# Patient Record
Sex: Male | Born: 1985 | Race: White | Hispanic: No | Marital: Single | Smoking: Current every day smoker
Health system: Southern US, Community
[De-identification: ages and names within clinical notes are randomized; demographics above are authoritative.]

## PROBLEM LIST (undated history)

## (undated) HISTORY — PX: KNEE SURGERY: SHX244

## (undated) HISTORY — PX: SKULL FRACTURE ELEVATION: SHX781

---

## 2005-04-23 ENCOUNTER — Emergency Department: Payer: Self-pay | Admitting: Emergency Medicine

## 2005-12-25 ENCOUNTER — Emergency Department: Payer: Self-pay | Admitting: Emergency Medicine

## 2007-01-30 ENCOUNTER — Emergency Department: Payer: Self-pay | Admitting: Emergency Medicine

## 2007-02-03 ENCOUNTER — Emergency Department: Payer: Self-pay | Admitting: Unknown Physician Specialty

## 2007-02-04 ENCOUNTER — Emergency Department: Payer: Self-pay | Admitting: Unknown Physician Specialty

## 2008-05-31 ENCOUNTER — Emergency Department: Payer: Self-pay | Admitting: Emergency Medicine

## 2008-06-01 ENCOUNTER — Emergency Department: Payer: Self-pay | Admitting: Emergency Medicine

## 2008-06-02 ENCOUNTER — Emergency Department: Payer: Self-pay | Admitting: Emergency Medicine

## 2009-01-19 ENCOUNTER — Emergency Department: Payer: Self-pay | Admitting: Emergency Medicine

## 2010-07-22 ENCOUNTER — Inpatient Hospital Stay: Payer: Self-pay | Admitting: Psychiatry

## 2012-08-04 ENCOUNTER — Emergency Department: Payer: Self-pay | Admitting: Emergency Medicine

## 2013-01-16 ENCOUNTER — Emergency Department: Payer: Self-pay | Admitting: Emergency Medicine

## 2013-02-10 ENCOUNTER — Emergency Department: Payer: Self-pay | Admitting: Emergency Medicine

## 2014-01-04 ENCOUNTER — Emergency Department (HOSPITAL_COMMUNITY)
Admission: EM | Admit: 2014-01-04 | Discharge: 2014-01-04 | Disposition: A | Discharge status: Home / Self Care | Payer: Self-pay | Attending: Emergency Medicine | Admitting: Emergency Medicine

## 2014-01-04 ENCOUNTER — Encounter (HOSPITAL_COMMUNITY): Payer: Self-pay | Admitting: Emergency Medicine

## 2014-01-04 ENCOUNTER — Emergency Department (HOSPITAL_COMMUNITY): Payer: Self-pay

## 2014-01-04 DIAGNOSIS — S20229A Contusion of unspecified back wall of thorax, initial encounter: Secondary | ICD-10-CM | POA: Insufficient documentation

## 2014-01-04 DIAGNOSIS — F17200 Nicotine dependence, unspecified, uncomplicated: Secondary | ICD-10-CM | POA: Insufficient documentation

## 2014-01-04 DIAGNOSIS — Y9389 Activity, other specified: Secondary | ICD-10-CM | POA: Insufficient documentation

## 2014-01-04 DIAGNOSIS — Y92410 Unspecified street and highway as the place of occurrence of the external cause: Secondary | ICD-10-CM | POA: Insufficient documentation

## 2014-01-04 DIAGNOSIS — M549 Dorsalgia, unspecified: Secondary | ICD-10-CM

## 2014-01-04 DIAGNOSIS — T148XXA Other injury of unspecified body region, initial encounter: Secondary | ICD-10-CM

## 2014-01-04 DIAGNOSIS — E669 Obesity, unspecified: Secondary | ICD-10-CM | POA: Insufficient documentation

## 2014-01-04 MED ORDER — IBUPROFEN 600 MG PO TABS: 600.0000 mg | ORAL_TABLET | Freq: Four times a day (QID) | ORAL | Status: DC | PRN

## 2014-01-04 MED ORDER — SODIUM CHLORIDE 0.9 % IV BOLUS (SEPSIS)
1000.0000 mL | Freq: Once | INTRAVENOUS | Status: AC
  Administered 2014-01-04: 1000 mL via INTRAVENOUS

## 2014-01-04 MED ORDER — ONDANSETRON HCL 4 MG/2ML IJ SOLN
4.0000 mg | Freq: Once | INTRAMUSCULAR | Status: AC
  Administered 2014-01-04: 4 mg via INTRAVENOUS
  Filled 2014-01-04: qty 2

## 2014-01-04 MED ORDER — MORPHINE SULFATE 4 MG/ML IJ SOLN
4.0000 mg | Freq: Once | INTRAMUSCULAR | Status: AC
  Administered 2014-01-04: 4 mg via INTRAVENOUS
  Filled 2014-01-04: qty 1

## 2014-01-04 MED ORDER — TRAMADOL HCL 50 MG PO TABS: 50.0000 mg | ORAL_TABLET | Freq: Four times a day (QID) | ORAL | Status: DC | PRN
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
## 2014-01-04 NOTE — ED Notes (Signed)
Pt ambulated in hallway tolerated well.

## 2014-01-04 NOTE — ED Notes (Signed)
Pt taken to xray 

## 2014-01-04 NOTE — Discharge Instructions (Signed)
Back Pain, Adult Low back pain is very common. About 1 in 5 people have back pain.The cause of low back pain is rarely dangerous. The pain often gets better over time.About half of people with a sudden onset of back pain feel better in just 2 weeks. About 8 in 10 people feel better by 6 weeks.  CAUSES Some common causes of back pain include:  Strain of the muscles or ligaments supporting the spine.  Wear and tear (degeneration) of the spinal discs.  Arthritis.  Direct injury to the back. DIAGNOSIS Most of the time, the direct cause of low back pain is not known.However, back pain can be treated effectively even when the exact cause of the pain is unknown.Answering your caregiver's questions about your overall health and symptoms is one of the most accurate ways to make sure the cause of your pain is not dangerous. If your caregiver needs more information, he or she may order lab work or imaging tests (X-rays or MRIs).However, even if imaging tests show changes in your back, this usually does not require surgery. HOME CARE INSTRUCTIONS For many people, back pain returns.Since low back pain is rarely dangerous, it is often a condition that people can learn to manageon their own.   Remain active. It is stressful on the back to sit or stand in one place. Do not sit, drive, or stand in one place for more than 30 minutes at a time. Take short walks on level surfaces as soon as pain allows.Try to increase the length of time you walk each day.  Do not stay in bed.Resting more than 1 or 2 days can delay your recovery.  Do not avoid exercise or work.Your body is made to move.It is not dangerous to be active, even though your back may hurt.Your back will likely heal faster if you return to being active before your pain is gone.  Pay attention to your body when you bend and lift. Many people have less discomfortwhen lifting if they bend their knees, keep the load close to their bodies,and  avoid twisting. Often, the most comfortable positions are those that put less stress on your recovering back.  Find a comfortable position to sleep. Use a firm mattress and lie on your side with your knees slightly bent. If you lie on your back, put a pillow under your knees.  Only take over-the-counter or prescription medicines as directed by your caregiver. Over-the-counter medicines to reduce pain and inflammation are often the most helpful.Your caregiver may prescribe muscle relaxant drugs.These medicines help dull your pain so you can more quickly return to your normal activities and healthy exercise.  Put ice on the injured area.  Put ice in a plastic bag.  Place a towel between your skin and the bag.  Leave the ice on for 15-20 minutes, 03-04 times a day for the first 2 to 3 days. After that, ice and heat may be alternated to reduce pain and spasms.  Ask your caregiver about trying back exercises and gentle massage. This may be of some benefit.  Avoid feeling anxious or stressed.Stress increases muscle tension and can worsen back pain.It is important to recognize when you are anxious or stressed and learn ways to manage it.Exercise is a great option. SEEK MEDICAL CARE IF:  You have pain that is not relieved with rest or medicine.  You have pain that does not improve in 1 week.  You have new symptoms.  You are generally not feeling well. SEEK   IMMEDIATE MEDICAL CARE IF:   You have pain that radiates from your back into your legs.  You develop new bowel or bladder control problems.  You have unusual weakness or numbness in your arms or legs.  You develop nausea or vomiting.  You develop abdominal pain.  You feel faint. Document Released: 08/06/2005 Document Revised: 02/05/2012 Document Reviewed: 12/25/2010 Exit[employer] Patient Information 2014 [employer].  

## 2014-02-13 ENCOUNTER — Emergency Department: Payer: Self-pay | Admitting: Emergency Medicine

## 2014-06-19 ENCOUNTER — Emergency Department: Payer: Self-pay | Admitting: Emergency Medicine

## 2016-05-04 IMAGING — CR DG LUMBAR SPINE 2-3V
1 series · 3 of 3 positions shown · non-contrast
Comparison: None.

CLINICAL DATA: Back injury bending over. Low back pain. Initial
encounter.

EXAM:
LUMBAR SPINE - 2-3 VIEW

[Series 1: dxr lumbar spine ap and lateral · 0.14mm/px · 3 of 3 slices shown]
[im 1/3]
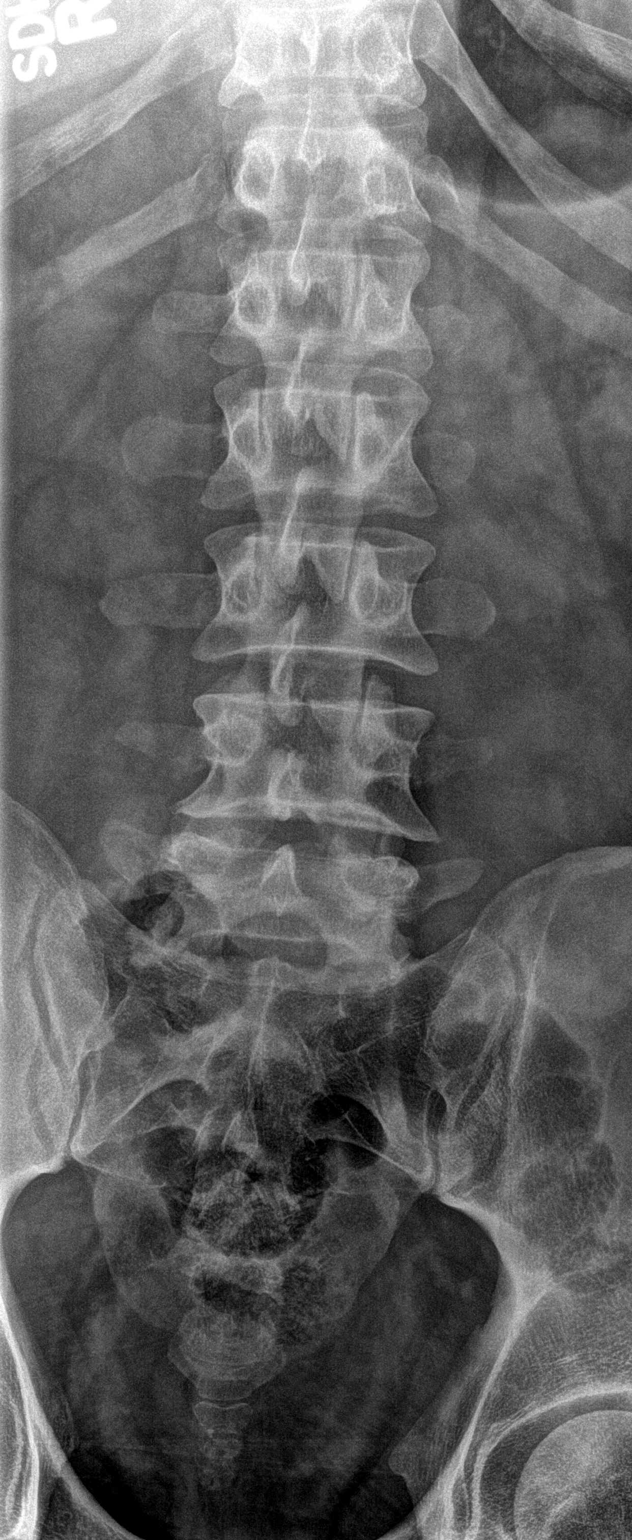
[im 2/3]
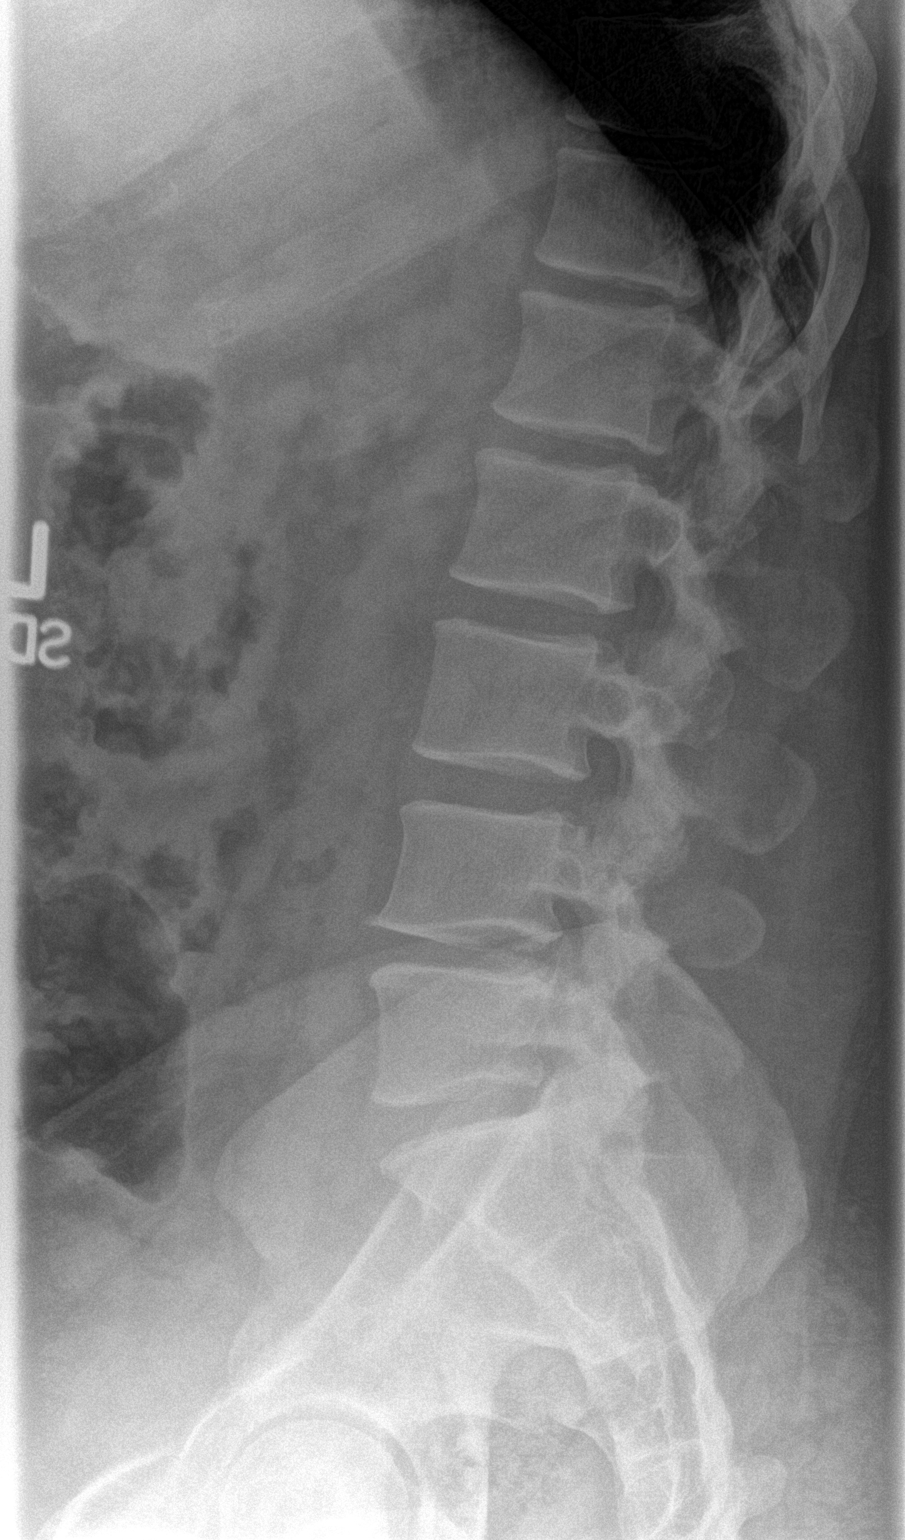
[im 3/3]
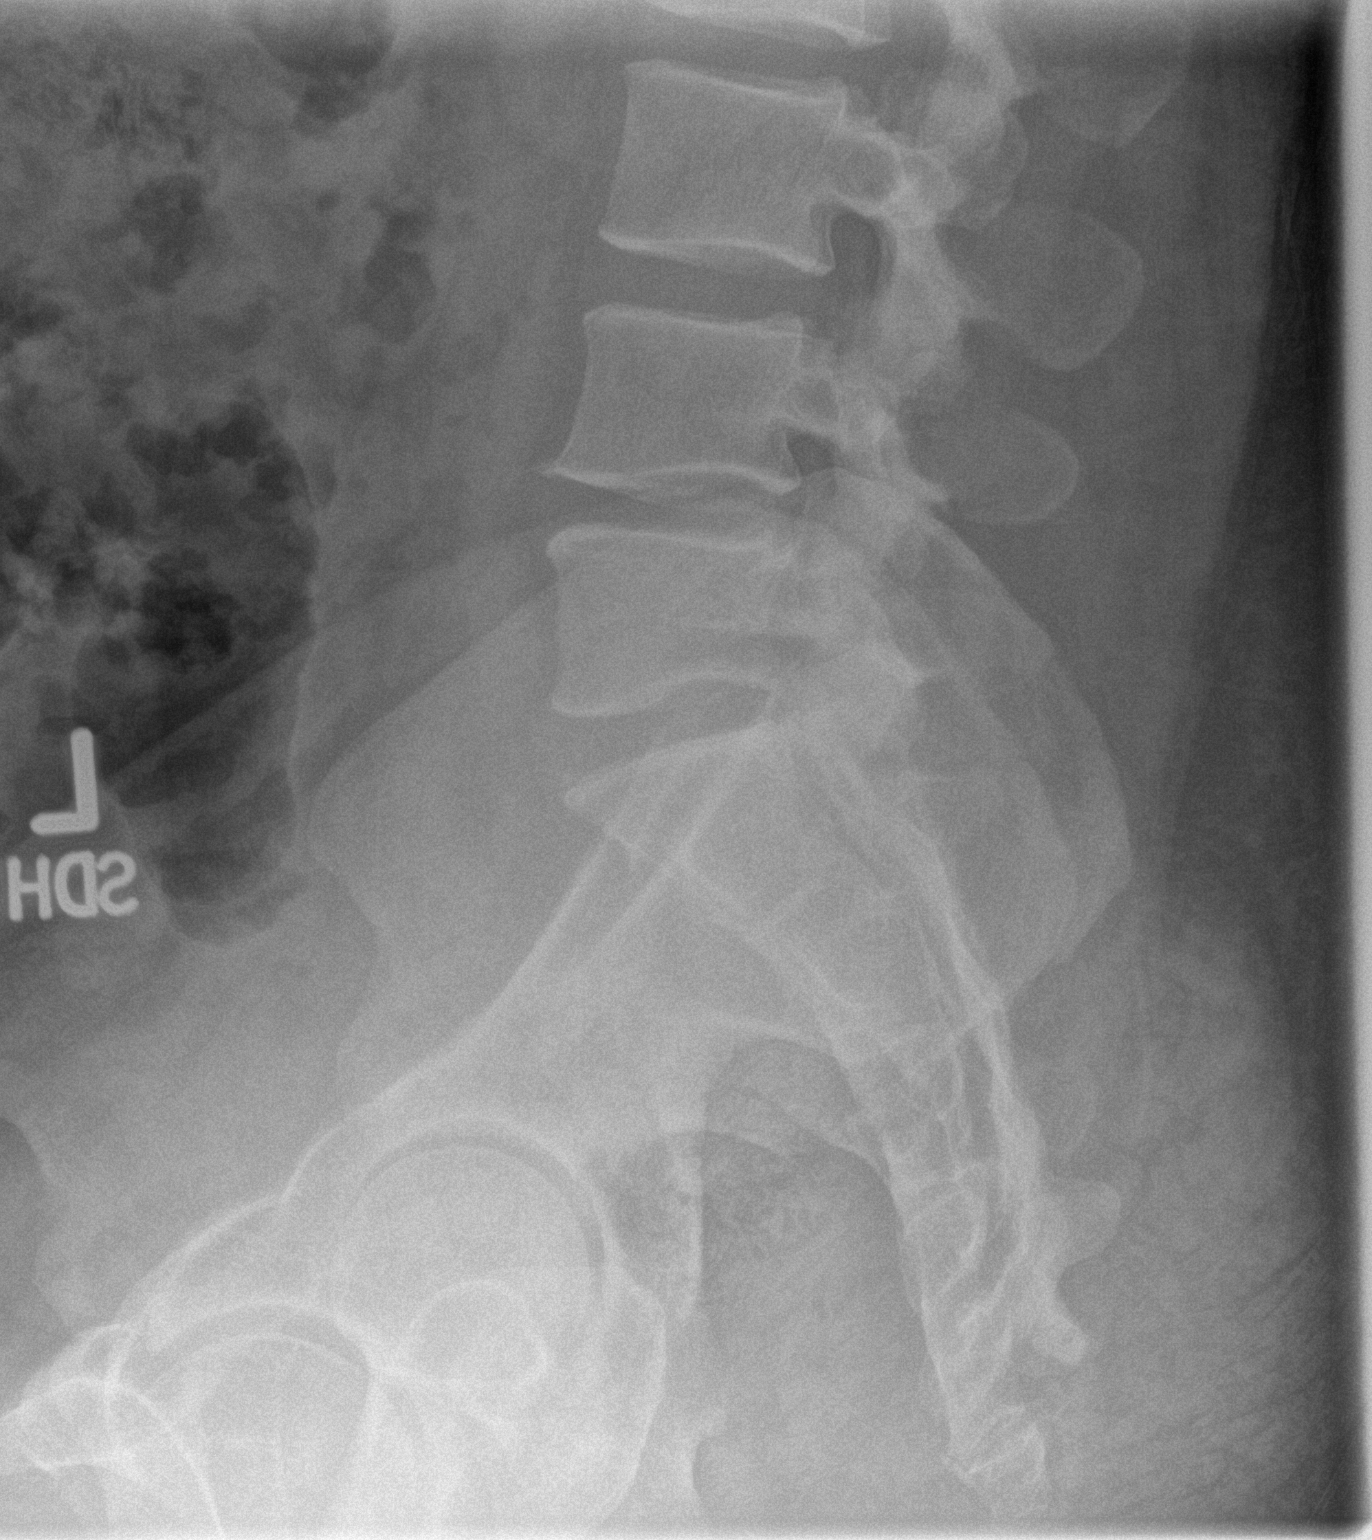

[3 of 3 positions shown; findings below may reference images not displayed]

FINDINGS: There is no evidence of lumbar spine fracture. Alignment is normal.
Mild degenerative disc disease is seen at L4-5.
IMPRESSION: No acute findings.

Mild L4-5 degenerative disc disease.

## 2019-03-19 DIAGNOSIS — G4733 Obstructive sleep apnea (adult) (pediatric): Secondary | ICD-10-CM | POA: Insufficient documentation

## 2019-03-20 DIAGNOSIS — F418 Other specified anxiety disorders: Secondary | ICD-10-CM | POA: Insufficient documentation

## 2024-07-27 DIAGNOSIS — R600 Localized edema: Secondary | ICD-10-CM | POA: Insufficient documentation

## 2024-07-28 ENCOUNTER — Ambulatory Visit (INDEPENDENT_AMBULATORY_CARE_PROVIDER_SITE_OTHER): Payer: Self-pay | Admitting: Internal Medicine

## 2024-07-28 VITALS — BP 186/78 | HR 96 | Resp 18 | Ht 67.0 in | Wt 342.0 lb

## 2024-07-28 DIAGNOSIS — R600 Localized edema: Secondary | ICD-10-CM | POA: Diagnosis not present

## 2024-07-28 DIAGNOSIS — Z6841 Body Mass Index (BMI) 40.0 and over, adult: Secondary | ICD-10-CM | POA: Diagnosis not present

## 2024-07-28 DIAGNOSIS — G2581 Restless legs syndrome: Secondary | ICD-10-CM

## 2024-07-28 DIAGNOSIS — G4733 Obstructive sleep apnea (adult) (pediatric): Secondary | ICD-10-CM

## 2024-07-28 DIAGNOSIS — I10 Essential (primary) hypertension: Secondary | ICD-10-CM | POA: Diagnosis not present

## 2024-07-28 DIAGNOSIS — F418 Other specified anxiety disorders: Secondary | ICD-10-CM

## 2024-07-28 NOTE — Patient Instructions (Signed)
 Living With Sleep Apnea Sleep apnea is a condition that affects your breathing while you're sleeping. Your tongue or the tissue in your throat may block the flow of air while you sleep. You may have shallow breathing or stop breathing for short periods of time. The breaks in breathing interrupt the deep sleep that you need to feel rested. Even if you don't wake up from the gaps in breathing, you may feel tired during the day. People with sleep apnea may snore loudly. You may have a headache in the morning and feel anxious or depressed. How can sleep apnea affect me? Sleep apnea increases your chances of being very tired during the day. This is called daytime fatigue. Sleep apnea can also increase your risk of: Heart attack. Stroke. Obesity. Type 2 diabetes. Heart failure. Irregular heartbeat. High blood pressure. If you are very tired during the day, you may be more likely to: Not do well in school or at work. Fall asleep while driving. Have trouble paying attention. Develop depression or anxiety. Have problems having sex. This is called sexual dysfunction. What actions can I take to manage sleep apnea? Sleep apnea treatment  If you were given a device to open your airway while you sleep, use it only as told by your health care provider. You may be given: An oral appliance. This is a mouthpiece that shifts your lower jaw forward. A continuous positive airway pressure (CPAP) device. This blows air through a mask. A nasal expiratory positive airway pressure (EPAP) device. This has valves that you put into each nostril. A bi-level positive airway pressure (BIPAP) device. This blows air through a mask when you breathe in and breathe out. You may need surgery if other treatments don't work for you. Sleep habits Go to sleep and wake up at the same time every day. This helps set your internal clock for sleeping. If you stay up later than usual on weekends, try to get up in the morning within 2  hours of the time you usually wake up. Try to get at least 7-9 hours of sleep each night. Stop using a computer, tablet, and mobile phone a few hours before bedtime. Do not take long naps during the day. If you nap, limit it to 30 minutes. Have a relaxing bedtime routine. Reading or listening to music may relax you and help you sleep. Use your bedroom only for sleep. Keep your television and computer out of your bedroom. Keep your bedroom cool, dark, and quiet. Use a supportive mattress and pillows. Follow your provider's instructions for other changes to sleep habits. Nutrition Do not eat big meals in the evening. Do not have caffeine in the later part of the day. The effects of caffeine can last for more than 5 hours. Follow your provider's instructions for any changes to what you eat and drink. Lifestyle Do not drink alcohol before bedtime. Alcohol can cause you to fall asleep at first, but then it can cause you to wake up in the middle of the night and have trouble getting back to sleep. Do not smoke, vape, or use nicotine or tobacco. Medicines Take over-the-counter and prescription medicines only as told by your provider. Do not use over-the-counter sleep medicine. You may become dependent on this medicine, and it can make sleep apnea worse. Do not take medicines, such as sedatives and narcotics, unless told to by your provider. Activity Exercise on most days, but avoid exercising in the evening. Exercising near bedtime can interfere with sleeping.  If possible, spend time outside every day. Natural light helps with your internal clock. General information Lose weight if you need to. Stay at a healthy weight. If you are having surgery, make sure to tell your provider that you have sleep apnea. You may need to bring your device with you. Keep all follow-up visits. Your provider will want to check on your condition. Where to find more information National Heart, Lung, and Blood  Institute: [employer] This information is not intended to replace advice given to you by your health care provider. Make sure you discuss any questions you have with your health care provider. Document Revised: 11/28/2022 Document Reviewed: 11/28/2022 Elsevier Patient Education  2024 [employer].

## 2024-07-28 NOTE — Progress Notes (Unsigned)
 Sleep Medicine   Office Visit  Patient Name: Harry Hunt DOB: 04-13-1986 MRN [id number]    Chief Complaint: Sleep consult  Brief History:  Harry Hunt presents for an initial sleep evaluation with a 9 year history of diagnosed severe OSA. Patient is currently not on PAP treatment. Sleep quality is poor. This is noted most nights. The patient's bed partner reports loud snoring, witnessed apnea, and leg movements at night. The patient relates the following symptoms: EDS, brain fog, uses caffeine to stay awake during the day, trouble focusing, and slow to start in the mornings are also present. The patient goes to sleep at 12 am and wakes up at 5:45 am - 6:30 am. The patient reports waking 2-3 times during the night to use the restroom. Sleep quality is the same when outside home environment.  Patient has noted restlessness of his legs at night. Has tried RLS meds before and did not help. The patient  relates some unusual behavior during the night.  The patient reports anxiety and depression as a history of psychiatric problems. The Epworth Sleepiness Score is 22 out of 24 .  The patient relates Cardiovascular risk factors include: HTN, leg edema. States echo for concern of CHF done recently due to significant fluid retention, but states this looked ok. He was supposed to have back surgery, however on hold until apnea re-evaluated and treated.   ROS  General: (-) fever, (-) chills, (-) night sweat Nose and Sinuses: (-) nasal stuffiness or itchiness, (-) postnasal drip, (-) nosebleeds, (-) sinus trouble. Mouth and Throat: (-) sore throat, (-) hoarseness. Neck: (-) swollen glands, (-) enlarged thyroid, (-) neck pain. Respiratory: + cough, + shortness of breath, - wheezing. Neurologic: + numbness, + tingling. Psychiatric: + anxiety, + depression Sleep behavior: -sleep paralysis -hypnogogic hallucinations +dream enactment      -vivid dreams -cataplexy -night terrors -sleep walking   Current  Medication: Outpatient Encounter Medications as of 07/28/2024  Medication Sig   albuterol (VENTOLIN HFA) 108 (90 Base) MCG/ACT inhaler Inhale 2 puffs into the lungs.   busPIRone (BUSPAR) 30 MG tablet Take 30 mg by mouth.   diclofenac (VOLTAREN) 75 MG EC tablet Take 75 mg by mouth.   FLUoxetine (PROZAC) 20 MG capsule Take 40 mg by mouth.   OLANZapine (ZYPREXA) 5 MG tablet Take 5 mg by mouth.   topiramate (TOPAMAX) 100 MG tablet Take 100 mg by mouth.   VRAYLAR 1.5 MG capsule Take 1.5 mg by mouth.   golimumab (SIMPONI ARIA) 50 MG/4ML SOLN injection Inject 2 mg/kg into the vein.   JARDIANCE 10 MG TABS tablet Take 10 mg by mouth.   losartan (COZAAR) 25 MG tablet Take 25 mg by mouth.   propranolol (INDERAL) 10 MG tablet Take 10 mg by mouth.   torsemide (DEMADEX) 20 MG tablet 3 tablets Oral Once a day; Duration: 30 days   [DISCONTINUED] ibuprofen  (ADVIL ,MOTRIN ) 600 MG tablet Take 1 tablet (600 mg total) by mouth every 6 (six) hours as needed.   [DISCONTINUED] traMADol  (ULTRAM ) 50 MG tablet Take 1 tablet (50 mg total) by mouth every 6 (six) hours as needed.   No facility-administered encounter medications on file as of 07/28/2024.    Surgical History: Past Surgical History:  Procedure Laterality Date   KNEE SURGERY     SKULL FRACTURE ELEVATION      Medical History: History reviewed. No pertinent past medical history.  Family History: Non contributory to the present illness  Social History: Social History   Socioeconomic History  Marital status: Single    Spouse name: Not on file   Number of children: Not on file   Years of education: Not on file   Highest education level: Not on file  Occupational History   Not on file  Tobacco Use   Smoking status: Every Day   Smokeless tobacco: Not on file  Substance and Sexual Activity   Alcohol use: Yes   Drug use: No   Sexual activity: Not on file  Other Topics Concern   Not on file  Social History Narrative   Not on file   Social  Drivers of Health   Financial Resource Strain: Not on file  Food Insecurity: Not on file  Transportation Needs: Not on file  Physical Activity: Not on file  Stress: Not on file  Social Connections: Not on file  Intimate Partner Violence: Not on file    Vital Signs: Blood pressure (!) 186/78, pulse 96, resp. rate 18, height 5' 7 (1.702 m), weight (!) 342 lb (155.1 kg), SpO2 96%. Body mass index is 53.56 kg/m.   Examination: General Appearance: The patient is well-developed, well-nourished, and in no distress. Neck Circumference: 51.5 cm Skin: Gross inspection of skin unremarkable. Head: normocephalic, no gross deformities. Eyes: no gross deformities noted. ENT: ears appear grossly normal Neurologic: Alert and oriented. No involuntary movements.    STOP BANG RISK ASSESSMENT S (snore) Have you been told that you snore?     YES   T (tired) Are you often tired, fatigued, or sleepy during the day?   YES  O (obstruction) Do you stop breathing, choke, or gasp during sleep? YES   P (pressure) Do you have or are you being treated for high blood pressure? YES   B (BMI) Is your body index greater than 35 kg/m? YES   A (age) Are you 46 years old or older? NO   N (neck) Do you have a neck circumference greater than 16 inches?   YES   G (gender) Are you a male? YES   TOTAL STOP/BANG "YES" ANSWERS 7                                                               A STOP-Bang score of 2 or less is considered low risk, and a score of 5 or more is high risk for having either moderate or severe OSA. For people who score 3 or 4, doctors may need to perform further assessment to determine how likely they are to have OSA.         EPWORTH SLEEPINESS SCALE:  Scale:  (0)= no chance of dozing; (1)= slight chance of dozing; (2)= moderate chance of dozing; (3)= high chance of dozing  Chance  Situtation    Sitting and reading: 3    Watching TV: 3    Sitting Inactive in public: 3     As a passenger in car: 3      Lying down to rest: 3    Sitting and talking: 3    Sitting quielty after lunch: 3    In a car, stopped in traffic: 1   TOTAL SCORE:   22 out of 24    SLEEP STUDIES:  SPLIT STUDY (07/18/2017) AHI 130.2/hr, min SP02 81%- CPAP@8cmh20  recommended.  LABS: No results found for this or any previous visit (from the past 2160 hours).  Radiology: DG Lumbar Spine 2-3 Views Result Date: 06/19/2014 **** PRIOR REPORT IMPORTED FROM AN EXTERNAL SYSTEM **** CLINICAL DATA:  Back injury bending over. Low back pain. Initial encounter. EXAM: LUMBAR SPINE - 2-3 VIEW COMPARISON:  None. FINDINGS: There is no evidence of lumbar spine fracture. Alignment is normal. Mild degenerative disc disease is seen at L4-5. IMPRESSION: No acute findings. Mild L4-5 degenerative disc disease. Electronically Signed   By: Norleen Kil M.D.   On: 06/19/2014 15:42     No results found.  No results found.    Assessment and Plan: Patient Active Problem List   Diagnosis Date Noted   Bilateral leg edema 07/27/2024   Depression with anxiety 03/20/2019   Obstructive sleep apnea syndrome 03/19/2019   Morbid obesity with BMI of 50.0-59.9, adult (HCC) 03/19/2019     PLAN OSA:   Patient evaluation suggests high risk of sleep disordered breathing due to hx of OSA, loud snoring, witnessed apnea, leg movements at night, EDS, brain fog, uses caffeine to stay awake during the day, trouble focusing, and slow to start in the mornings, and elevated BMI. Patient has comorbid cardiovascular risk factors including: HTN, leg edema which could be exacerbated by pathologic sleep-disordered breathing.  Suggest: PSG/split to assess/treat the patient's sleep disordered breathing. The patient was also counselled on wt loss to optimize sleep health. Cautioned on driving if sleepy.  1. Severe obstructive sleep apnea Will order PSG/split study given hx of very severe OSA. Needs in-lab testing given severity  and concern for RLS as well.  2. RLS (restless legs syndrome) Will order in-lab PSG for further evaluation  3. Depression with anxiety Continue current medication and f/u with PCP.  4. Bilateral leg edema Followed by cardiology  5. Morbid obesity with BMI of 50.0-59.9, adult (HCC) Obesity Counseling: Had a lengthy discussion regarding patients BMI and weight issues. Patient was instructed on portion control as well as increased activity. Also discussed caloric restrictions with trying to maintain intake less than 2000 Kcal. Discussions were made in accordance with the 5As of weight management. Simple actions such as not eating late and if able to, taking a walk is suggested.  6. Essential hypertension Elevated in office, but has been doing ok. Continue current medication and monitoring.    General Counseling: I have discussed the findings of the evaluation and examination with Harry Hunt.  I have also discussed any further diagnostic evaluation thatmay be needed or ordered today. Harry Hunt verbalizes understanding of the findings of todays visit. We also reviewed his medications today and discussed drug interactions and side effects including but not limited excessive drowsiness and altered mental states. We also discussed that there is always a risk not just to him but also people around him. he has been encouraged to call the office with any questions or concerns that should arise related to todays visit.  No orders of the defined types were placed in this encounter.       I have personally obtained a history, evaluated the patient, evaluated pertinent data, formulated the assessment and plan and placed orders.  This patient was seen by Tinnie Pro, PA-C in collaboration with Dr. Elfreda Bathe as a part of collaborative care agreement.    Elfreda DELENA Bathe, MD [facility] Diplomate ABMS Pulmonary and Critical Care Medicine Sleep medicine
